# Patient Record
Sex: Male | Born: 1970 | Race: Black or African American | Hispanic: No | Marital: Single | Smoking: Former smoker
Health system: Southern US, Community
[De-identification: ages and names within clinical notes are randomized; demographics above are authoritative.]

## PROBLEM LIST (undated history)

## (undated) DIAGNOSIS — E119 Type 2 diabetes mellitus without complications: Secondary | ICD-10-CM

## (undated) DIAGNOSIS — I10 Essential (primary) hypertension: Secondary | ICD-10-CM

---

## 2013-02-23 ENCOUNTER — Other Ambulatory Visit (HOSPITAL_COMMUNITY): Payer: Self-pay | Admitting: Surgery

## 2013-02-23 DIAGNOSIS — I729 Aneurysm of unspecified site: Secondary | ICD-10-CM

## 2013-03-09 ENCOUNTER — Other Ambulatory Visit (HOSPITAL_COMMUNITY): Payer: Self-pay | Admitting: Interventional Radiology

## 2013-03-09 ENCOUNTER — Ambulatory Visit (HOSPITAL_COMMUNITY)
Admission: RE | Admit: 2013-03-09 | Discharge: 2013-03-09 | Disposition: A | Discharge status: Home / Self Care | Payer: Self-pay | Source: Ambulatory Visit | Attending: Surgery | Admitting: Surgery

## 2013-03-09 ENCOUNTER — Ambulatory Visit (HOSPITAL_COMMUNITY): Admission: RE | Admit: 2013-03-09 | Payer: Self-pay | Source: Ambulatory Visit

## 2013-03-09 DIAGNOSIS — I729 Aneurysm of unspecified site: Secondary | ICD-10-CM

## 2013-03-20 ENCOUNTER — Other Ambulatory Visit: Payer: Self-pay | Admitting: Radiology

## 2013-03-23 ENCOUNTER — Other Ambulatory Visit (HOSPITAL_COMMUNITY): Payer: Self-pay | Admitting: Interventional Radiology

## 2013-03-23 ENCOUNTER — Ambulatory Visit (HOSPITAL_COMMUNITY)
Admission: RE | Admit: 2013-03-23 | Discharge: 2013-03-23 | Disposition: A | Discharge status: Home / Self Care | Payer: Self-pay | Source: Ambulatory Visit | Attending: Interventional Radiology | Admitting: Interventional Radiology

## 2013-03-23 ENCOUNTER — Encounter (HOSPITAL_COMMUNITY): Payer: Self-pay

## 2013-03-23 DIAGNOSIS — E119 Type 2 diabetes mellitus without complications: Secondary | ICD-10-CM | POA: Insufficient documentation

## 2013-03-23 DIAGNOSIS — F17200 Nicotine dependence, unspecified, uncomplicated: Secondary | ICD-10-CM | POA: Insufficient documentation

## 2013-03-23 DIAGNOSIS — R51 Headache: Secondary | ICD-10-CM | POA: Insufficient documentation

## 2013-03-23 DIAGNOSIS — I10 Essential (primary) hypertension: Secondary | ICD-10-CM | POA: Insufficient documentation

## 2013-03-23 DIAGNOSIS — R42 Dizziness and giddiness: Secondary | ICD-10-CM | POA: Insufficient documentation

## 2013-03-23 DIAGNOSIS — I7789 Other specified disorders of arteries and arterioles: Secondary | ICD-10-CM | POA: Insufficient documentation

## 2013-03-23 DIAGNOSIS — E785 Hyperlipidemia, unspecified: Secondary | ICD-10-CM | POA: Insufficient documentation

## 2013-03-23 DIAGNOSIS — I729 Aneurysm of unspecified site: Secondary | ICD-10-CM

## 2013-03-23 DIAGNOSIS — F29 Unspecified psychosis not due to a substance or known physiological condition: Secondary | ICD-10-CM | POA: Insufficient documentation

## 2013-03-23 HISTORY — DX: Essential (primary) hypertension: I10

## 2013-03-23 HISTORY — DX: Type 2 diabetes mellitus without complications: E11.9

## 2013-03-23 LAB — BASIC METABOLIC PANEL
BUN: 13 mg/dL (ref 6–23)
CO2: 27 mEq/L (ref 19–32)
Calcium: 9.5 mg/dL (ref 8.4–10.5)
Chloride: 101 mEq/L (ref 96–112)
Creatinine, Ser: 0.79 mg/dL (ref 0.50–1.35)
Glucose, Bld: 245 mg/dL — ABNORMAL HIGH (ref 70–99)
POTASSIUM: 3.7 meq/L (ref 3.7–5.3)
SODIUM: 141 meq/L (ref 137–147)

## 2013-03-23 LAB — CBC
HCT: 35.4 % — ABNORMAL LOW (ref 39.0–52.0)
HEMOGLOBIN: 11.9 g/dL — AB (ref 13.0–17.0)
MCH: 30.7 pg (ref 26.0–34.0)
MCHC: 33.6 g/dL (ref 30.0–36.0)
MCV: 91.5 fL (ref 78.0–100.0)
Platelets: 240 10*3/uL (ref 150–400)
RBC: 3.87 MIL/uL — AB (ref 4.22–5.81)
RDW: 11.7 % (ref 11.5–15.5)
WBC: 5.4 10*3/uL (ref 4.0–10.5)

## 2013-03-23 LAB — PROTIME-INR
INR: 0.92 (ref 0.00–1.49)
PROTHROMBIN TIME: 12.2 s (ref 11.6–15.2)

## 2013-03-23 LAB — GLUCOSE, CAPILLARY: Glucose-Capillary: 155 mg/dL — ABNORMAL HIGH (ref 70–99)

## 2013-03-23 MED ORDER — FENTANYL CITRATE 0.05 MG/ML IJ SOLN
INTRAMUSCULAR | Status: AC | PRN
  Administered 2013-03-23: 25 ug via INTRAVENOUS

## 2013-03-23 MED ORDER — SODIUM CHLORIDE 0.9 % IV SOLN
Freq: Once | INTRAVENOUS | Status: AC
Start: 2013-03-23 — End: 2013-03-23
  Administered 2013-03-23: 75 mL/h via INTRAVENOUS

## 2013-03-23 MED ORDER — FENTANYL CITRATE 0.05 MG/ML IJ SOLN
INTRAMUSCULAR | Status: AC
  Filled 2013-03-23: qty 4

## 2013-03-23 MED ORDER — IOHEXOL 300 MG/ML  SOLN
150.0000 mL | Freq: Once | INTRAMUSCULAR | Status: AC | PRN
  Administered 2013-03-23: 80 mL via INTRA_ARTERIAL

## 2013-03-23 MED ORDER — INSULIN ASPART 100 UNIT/ML ~~LOC~~ SOLN
3.0000 [IU] | Freq: Once | SUBCUTANEOUS | Status: AC
  Administered 2013-03-23: 3 [IU] via SUBCUTANEOUS

## 2013-03-23 MED ORDER — SODIUM CHLORIDE 0.9 % IV SOLN: INTRAVENOUS | Status: AC

## 2013-03-23 MED ORDER — HEPARIN SODIUM (PORCINE) 1000 UNIT/ML IJ SOLN
INTRAMUSCULAR | Status: AC | PRN
  Administered 2013-03-23: 1000 [IU] via INTRAVENOUS

## 2013-03-23 MED ORDER — MIDAZOLAM HCL 2 MG/2ML IJ SOLN
INTRAMUSCULAR | Status: AC | PRN
  Administered 2013-03-23: 1 mg via INTRAVENOUS

## 2013-03-23 MED ORDER — MIDAZOLAM HCL 2 MG/2ML IJ SOLN
INTRAMUSCULAR | Status: AC
  Filled 2013-03-23: qty 4

## 2013-03-23 MED ORDER — INSULIN ASPART 100 UNIT/ML ~~LOC~~ SOLN
SUBCUTANEOUS | Status: AC
  Filled 2013-03-23: qty 1

## 2013-03-23 NOTE — Procedures (Signed)
S.P 4 veszsel cerebral arteriogram. RT CFA approach. Findings. 1.No eviidence of saccular aneurysm. Mild early intracranial arteriosclerosis.

## 2013-03-23 NOTE — ED Notes (Signed)
Patient denies pain and is resting comfortably.  

## 2013-03-23 NOTE — Discharge Instructions (Signed)
Groin Site Care Refer to this sheet in the next few weeks. These instructions provide you with information on caring for yourself after your procedure. Your caregiver may also give you more specific instructions. Your treatment has been planned according to current medical practices, but problems sometimes occur. Call your caregiver if you have any problems or questions after your procedure. HOME CARE INSTRUCTIONS  You may shower 24 hours after the procedure. Remove the bandage (dressing) and gently wash the site with plain soap and water. Gently pat the site dry.  Do not apply powder or lotion to the site.  Do not sit in a bathtub, swimming pool, or whirlpool for 5 to 7 days.  No bending, squatting, or lifting anything over 10 pounds (4.5 kg) as directed by your caregiver.  Inspect the site at least twice daily.  Do not drive home if you are discharged the same day of the procedure. Have someone else drive you.  You may drive 24 hours after the procedure unless otherwise instructed by your caregiver. What to expect:  Any bruising will usually fade within 1 to 2 weeks.  Blood that collects in the tissue (hematoma) may be painful to the touch. It should usually decrease in size and tenderness within 1 to 2 weeks. SEEK IMMEDIATE MEDICAL CARE IF:  You have unusual pain at the groin site or down the affected leg.  You have redness, warmth, swelling, or pain at the groin site.  You have drainage (other than a small amount of blood on the dressing).  You have chills.  You have a fever or persistent symptoms for more than 72 hours.  You have a fever and your symptoms suddenly get worse.  Your leg becomes pale, cool, tingly, or numb.  You have heavy bleeding from the site. Hold pressure on the site.  If bleeding is not under control call 911 Document Released: 04/07/2010 Document Revised: 05/28/2011 Document Reviewed: 04/07/2010 [facility]Exit[facility] Patient Information 2014 [place], [place].

## 2013-03-23 NOTE — Progress Notes (Signed)
Received pt from cath radiology procedure, alert and able to tolerate fluids.

## 2013-03-23 NOTE — H&P (Signed)
Travis Mora is an 43 y.o. male.   Chief Complaint: pt was admitted to [facility] 01/13/2013 due to confusion; memory loss.  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 Scheduled now for cerebral arteriogram to evaluate possible aneurysm  HPI: HTN; DM; HLD; smoker (quit 2 weeks ago)  Past Medical History  Diagnosis Date  . Hypertension   . Diabetes mellitus without complication     History reviewed. No pertinent past surgical history.  No family history on file. Social History:  reports that he quit smoking about 2 weeks ago. He does not have any smokeless tobacco history on file. His alcohol and drug histories are not on file.  Allergies: No Known Allergies   (Not in a hospital admission)  Results for orders placed during the hospital encounter of 03/23/13 (from the past 48 hour(s))  BASIC METABOLIC PANEL     Status: Abnormal   Collection Time    03/23/13 10:43 AM      Result Value Range   Sodium 141  137 - 147 mEq/L   Potassium 3.7  3.7 - 5.3 mEq/L   Chloride 101  96 - 112 mEq/L   CO2 27  19 - 32 mEq/L   Glucose, Bld 245 (*) 70 - 99 mg/dL   BUN 13  6 - 23 mg/dL   Creatinine, Ser 0.79  0.50 - 1.35 mg/dL   Calcium 9.5  8.4 - 10.5 mg/dL   GFR calc non Af Amer >90  >90 mL/min   GFR calc Af Amer >90  >90 mL/min   Comment: (NOTE)     The eGFR has been calculated using the CKD EPI equation.     This calculation has not been validated in all clinical situations.     eGFR's persistently <90 mL/min signify possible Chronic Kidney     Disease.  CBC     Status: Abnormal   Collection Time    03/23/13 10:43 AM      Result Value Range   WBC 5.4  4.0 - 10.5 K/uL   RBC 3.87 (*) 4.22 - 5.81 MIL/uL   Hemoglobin 11.9 (*) 13.0 - 17.0 g/dL   HCT 35.4 (*) 39.0 - 52.0 %   MCV 91.5  78.0 - 100.0 fL   MCH 30.7  26.0 - 34.0 pg   MCHC 33.6  30.0 - 36.0 g/dL   RDW  11.7  11.5 - 15.5 %   Platelets 240  150 - 400 K/uL  PROTIME-INR     Status: None   Collection Time    03/23/13 10:43 AM      Result Value Range   Prothrombin Time 12.2  11.6 - 15.2 seconds   INR 0.92  0.00 - 1.49   No results found.  Review of Systems  Constitutional: Negative for fever and weight loss.  Eyes: Positive for blurred vision. Negative for double vision.  Respiratory: Negative for shortness of breath.   Cardiovascular: Negative for chest pain.  Gastrointestinal: Negative for nausea, vomiting and abdominal pain.  Neurological: Positive for headaches. Negative for dizziness and weakness.  Psychiatric/Behavioral: Positive for memory loss.    Blood pressure 133/84, pulse 62, temperature 98 F (36.7 C), temperature source Oral, resp. rate 16, height 5' 11"  (1.803 m), weight 248 lb (112.492 kg), SpO2 98.00%. Physical Exam  Constitutional: He is oriented to person, place, and time. He appears well-nourished.  Cardiovascular: Normal rate, regular rhythm and normal heart sounds.   No murmur heard. Respiratory: Effort normal and breath sounds normal. He has no wheezes.  GI: Soft. Bowel sounds are normal. There is no tenderness.  Musculoskeletal: Normal range of motion.  Neurological: He is alert and oriented to person, place, and time.  Skin: Skin is warm and dry.  Psychiatric: He has a normal mood and affect. His behavior is normal. Judgment and thought content normal.     Assessment/Plan Pt with AMS 12/2012 Admitted to [facility] 12/2012 sxs now resolved Work up revealed poss R ICA aneurysm on MRI Pt now scheduled for cerebral arteriogram to fully evaluate  Pt and family aware of procedure benefits and risks and agreeable to porceed Consent signed and in chart  Larue A 03/23/2013, 12:08 PM

## 2013-03-23 NOTE — Progress Notes (Signed)
Discharge instructions given per MD order.  Pt and Cg able to verbalize understanding.  Pt to car via wheelchair.  

## 2013-03-24 LAB — GLUCOSE, CAPILLARY: Glucose-Capillary: 214 mg/dL — ABNORMAL HIGH (ref 70–99)

## 2015-08-13 IMAGING — XA IR ANGIO INTRA EXTRACRAN SEL COM CAROTID INNOMINATE BILAT MOD SE
1 series · 12 of 24 positions shown · IV contrast (IODINE)
Comparison: none

CLINICAL DATA: Episodes of confusion, headaches. Dizziness.
Suspected aneurysm on MRI examination of the brain.

[Series 300: neuro · 12 of 124 slices shown]
[im 6/124]
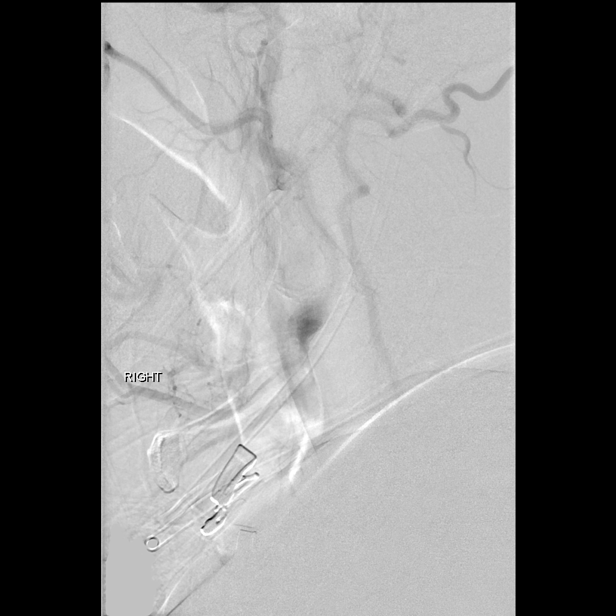
[im 17/124]
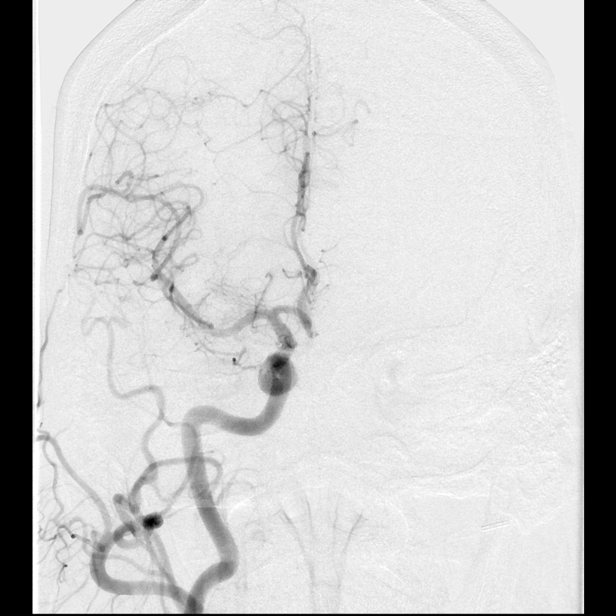
[im 27/124]
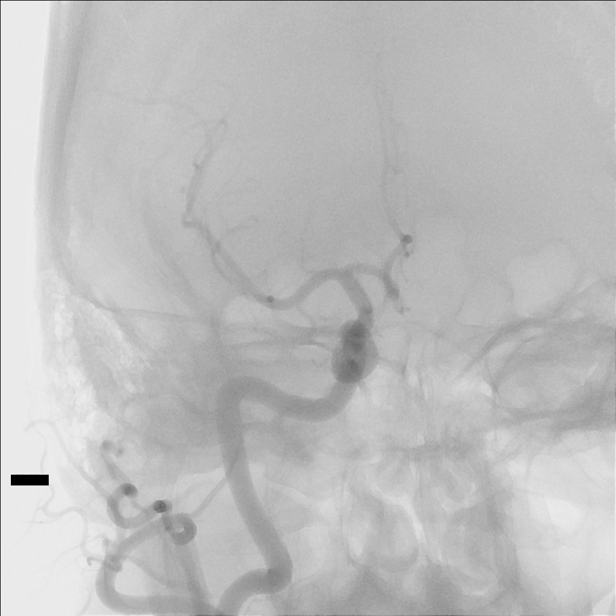
[im 38/124]
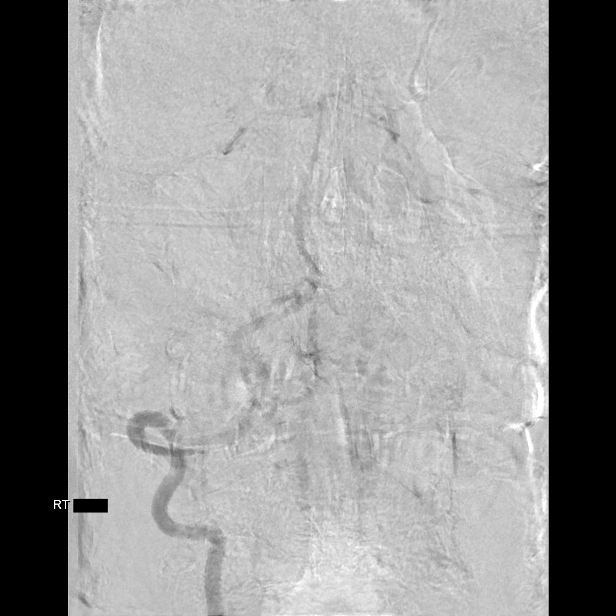
[im 49/124]
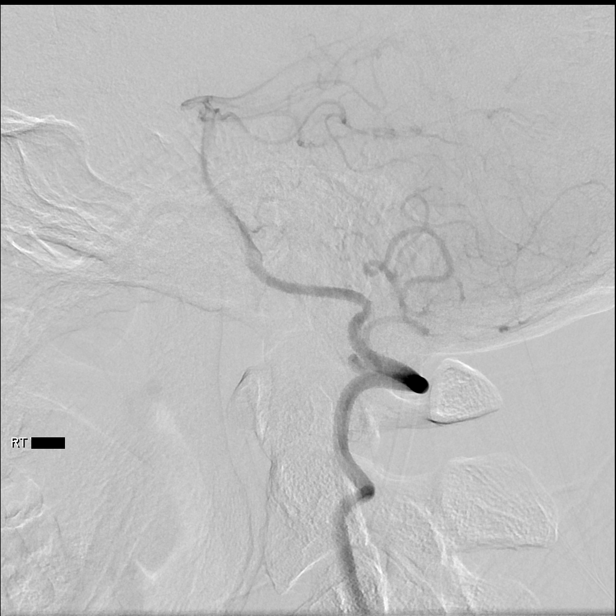
[im 59/124]
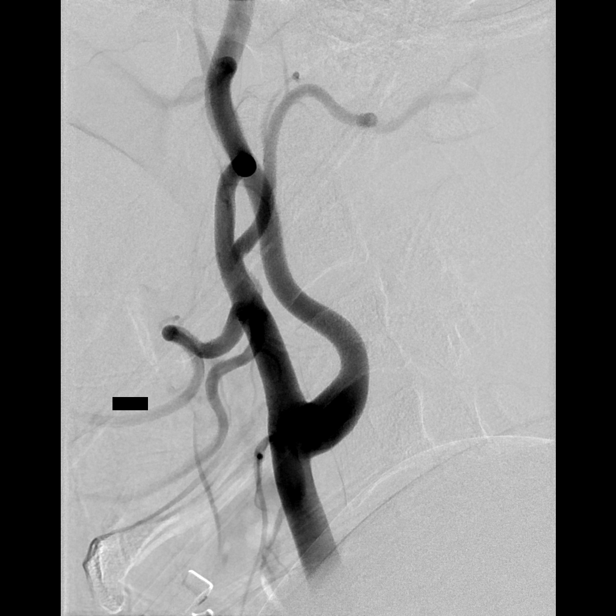
[im 70/124]
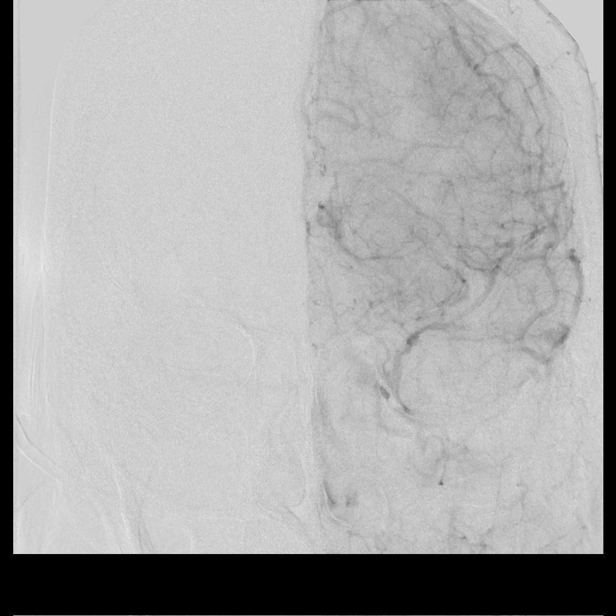
[im 81/124]
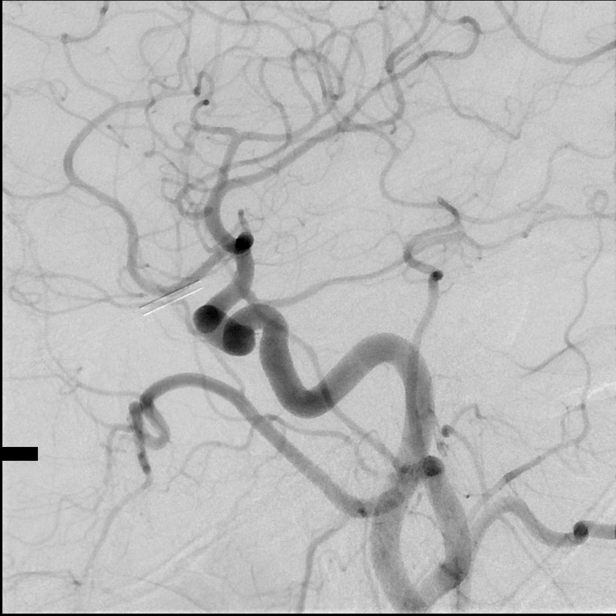
[im 91/124]
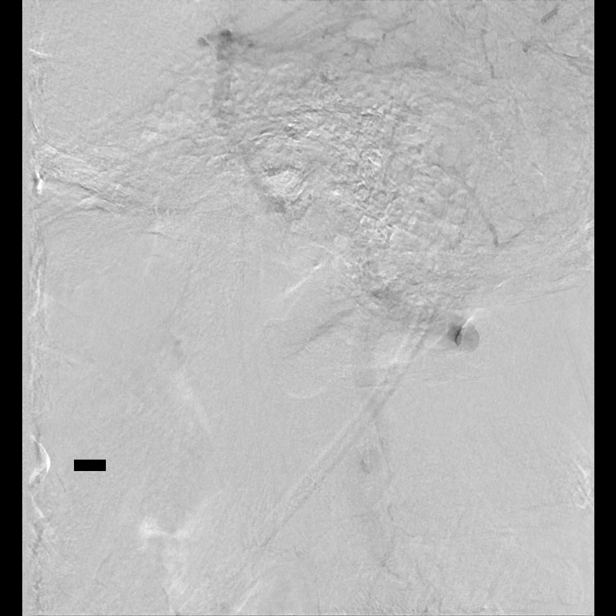
[im 102/124]
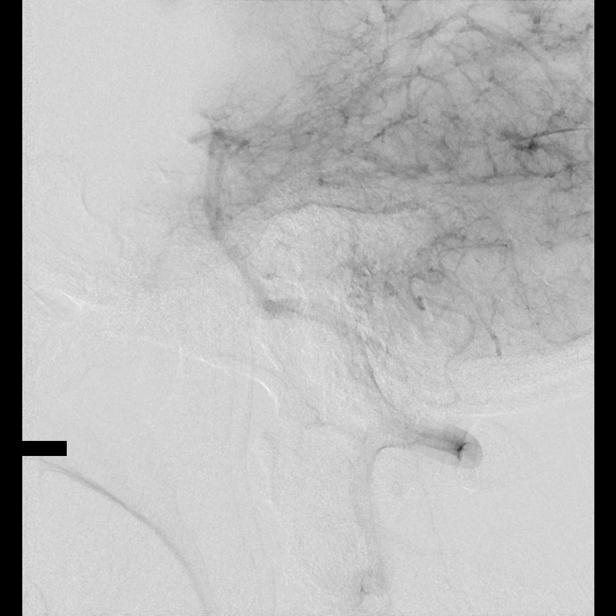
[im 113/124]
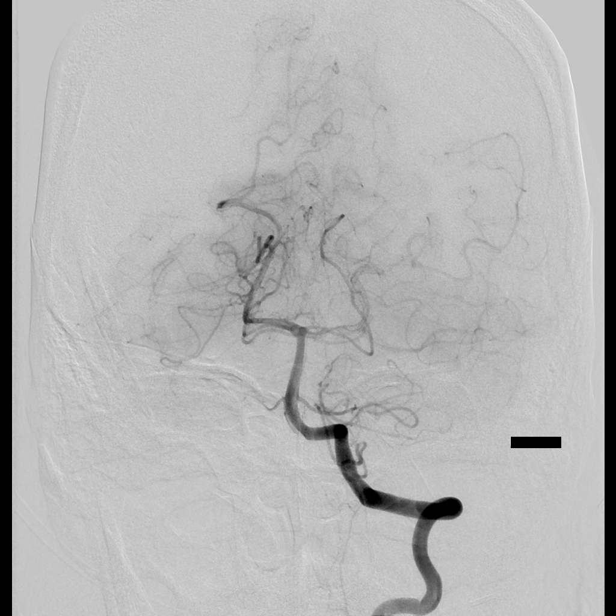
[im 124/124]
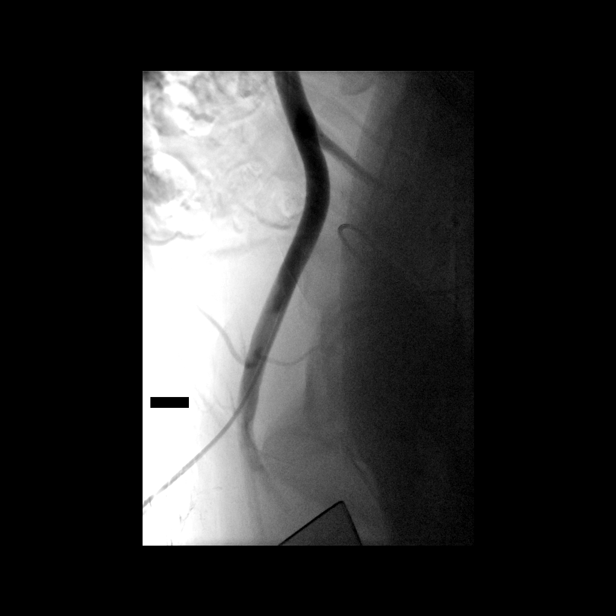

[12 of 24 positions shown; findings below may reference images not displayed]

EXAM:
BILATERAL COMMON CAROTID AND INNOMINATE ANGIOGRAPHY AND BILATERAL
VERTEBRAL ARTERY ANGIOGRAMS:

MEDICATIONS:
Versed 1 mg IV. Fentanyl 25 mcg IV.

ANESTHESIA/SEDATION:
Conscious sedation.

CONTRAST:  60 mL OMNIPAQUE IOHEXOL 300 MG/ML  SOLN

PROCEDURE:
Following a full explanation of the procedure along with the
potential associated complications, an informed witnessed consent
was obtained.

The right groin was prepped and draped in the usual sterile fashion.

Thereafter using modified Seldinger technique, transfemoral access
into right common femoral artery was obtained without difficulty.
Over a 0.035-inch guidewire, a 5 French Pinnacle sheath was
inserted. Through this and also over a 0.035-inch guidewire, a 5
French JB1 catheter was advanced to the aortic arch region and
selectively positioned in the right common carotid artery, the right
vertebral artery, the left common carotid artery and the left
vertebral artery.

There were no acute complications.

The patient tolerated the procedure well.

COMPLICATIONS:
None immediate.
FINDINGS: The right common carotid arteriogram demonstrates the right external
carotid artery and its major branches to be normal.

The right internal carotid artery at the bulb to the cranial skull
base opacifies normally.

The petrous segment is normal.

There is mild fusiform dilatation of the proximal cavernous segment
of the right internal carotid artery.

The supraclinoid segment is normally patent. The right posterior
communicating artery is seen opacifying the right posterior cerebral
artery distribution.

The right middle and the right anterior cerebral arteries opacify
normally into the capillary and venous phases.

A few scattered focal areas of caliber irregularity probably
represent intracranial arteriosclerosis are seen involving the right
pericallosal and the inferior divisions of the left middle cerebral
artery.

The venous phase demonstrates hypoplastic right transverse sinus
with preferential opacification via the vein of Labbe into the right
transverse sinus distally, and also via the sphenoid parietal sinus
into the right vertebral artery origin is normal.

The vessel extends normally to the cranial skull base and opacifies
the right posterior-inferior cerebellar artery.

The hypoplastic right vertebrobasilar junction is seen to opacify
the basilar artery and the superior cerebellar arteries in the
anterior inferior cerebellar arteries. Non-opacified blood was seen
in the basilar artery from the contralateral vertebral artery.

The left common carotid arteriogram demonstrates the left external
carotid artery and its major branches to be normal.

The left internal carotid artery at the bulb to the cranial skull
base opacifies normally.

The petrous segment is widely patent.

Mild fusiform dilatation is seen of the proximal cavernous and the
caval cavernous segments.

The supraclinoid left ICA is normal.

The left posterior communicating artery is seen opacifying the left
posterior cerebral artery distribution.

The left middle and the left anterior cerebral arteries are seen to
opacify into the capillary and venous phases.

Again demonstrated is hypoplastic right transverse sinus.
Opacification of the left vein of Labbe, the left sphenoparietal
sinus and subsequent left cavernous sinus is seen.

Few scattered focal areas of mild caliber irregularity are seen
involving the left anterior cerebral artery and middle cerebral
artery distribution, probably representing intracranial
arteriosclerosis.

The left vertebral artery origin is normal.

The vessel opacifies normally to the cranial skull base.

There is normal opacification of the left posterior inferior
cerebellar artery and the left vertebrobasilar junction.

The basilar artery, the opacified portions of the posterior cerebral
artery distributions, the superior cerebellar arteries and the
anterior inferior cerebellar arteries opacify normally into the
capillary and venous phases. The cavernous sinus and subsequently
through a prominent inferior petrosal sinus into the right internal
jugular vein at the level of the jugular bulb.
IMPRESSION: Mild fusiform dilatation of the proximal cavernous segments of both
internal carotid arteries probably representative of sequela of
chronic hypertension.

No evidence of a saccular aneurysm is seen.

## 2023-05-18 DEATH — deceased
